# Patient Record
Sex: Female | Born: 2000 | Race: White | Hispanic: No | Marital: Single | Smoking: Never smoker
Health system: Southern US, Community
[De-identification: ages and names within clinical notes are randomized; demographics above are authoritative.]

## PROBLEM LIST (undated history)

## (undated) DIAGNOSIS — M419 Scoliosis, unspecified: Secondary | ICD-10-CM

## (undated) DIAGNOSIS — F329 Major depressive disorder, single episode, unspecified: Secondary | ICD-10-CM

## (undated) DIAGNOSIS — F32A Depression, unspecified: Secondary | ICD-10-CM

---
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
## 2020-02-19 ENCOUNTER — Other Ambulatory Visit: Payer: Self-pay

## 2020-02-19 ENCOUNTER — Emergency Department: Payer: BC Managed Care – PPO

## 2020-02-19 ENCOUNTER — Emergency Department
Admission: EM | Admit: 2020-02-19 | Discharge: 2020-02-19 | Disposition: A | Discharge status: Home / Self Care | Payer: BC Managed Care – PPO | Attending: Emergency Medicine | Admitting: Emergency Medicine

## 2020-02-19 DIAGNOSIS — Y998 Other external cause status: Secondary | ICD-10-CM | POA: Insufficient documentation

## 2020-02-19 DIAGNOSIS — Y9389 Activity, other specified: Secondary | ICD-10-CM | POA: Insufficient documentation

## 2020-02-19 DIAGNOSIS — S299XXA Unspecified injury of thorax, initial encounter: Secondary | ICD-10-CM | POA: Diagnosis present

## 2020-02-19 DIAGNOSIS — Y92411 Interstate highway as the place of occurrence of the external cause: Secondary | ICD-10-CM | POA: Diagnosis not present

## 2020-02-19 DIAGNOSIS — S9031XA Contusion of right foot, initial encounter: Secondary | ICD-10-CM | POA: Insufficient documentation

## 2020-02-19 DIAGNOSIS — S2220XA Unspecified fracture of sternum, initial encounter for closed fracture: Secondary | ICD-10-CM | POA: Insufficient documentation

## 2020-02-19 DIAGNOSIS — S3991XA Unspecified injury of abdomen, initial encounter: Secondary | ICD-10-CM | POA: Diagnosis not present

## 2020-02-19 LAB — URINALYSIS, COMPLETE (UACMP) WITH MICROSCOPIC
Bacteria, UA: NONE SEEN
Bilirubin Urine: NEGATIVE
Glucose, UA: NEGATIVE mg/dL
Hgb urine dipstick: NEGATIVE
Ketones, ur: 20 mg/dL — AB
Leukocytes,Ua: NEGATIVE
Nitrite: NEGATIVE
Protein, ur: NEGATIVE mg/dL
Specific Gravity, Urine: 1.019 (ref 1.005–1.030)
WBC, UA: NONE SEEN WBC/hpf (ref 0–5)
pH: 8 (ref 5.0–8.0)

## 2020-02-19 LAB — POCT PREGNANCY, URINE: Preg Test, Ur: NEGATIVE

## 2020-02-19 LAB — CBC WITH DIFFERENTIAL/PLATELET
Abs Immature Granulocytes: 0.05 10*3/uL (ref 0.00–0.07)
Basophils Absolute: 0 10*3/uL (ref 0.0–0.1)
Basophils Relative: 0 %
Eosinophils Absolute: 0.1 10*3/uL (ref 0.0–0.5)
Eosinophils Relative: 1 %
HCT: 38.3 % (ref 36.0–46.0)
Hemoglobin: 12.9 g/dL (ref 12.0–15.0)
Immature Granulocytes: 1 %
Lymphocytes Relative: 11 %
Lymphs Abs: 1.2 10*3/uL (ref 0.7–4.0)
MCH: 30.1 pg (ref 26.0–34.0)
MCHC: 33.7 g/dL (ref 30.0–36.0)
MCV: 89.3 fL (ref 80.0–100.0)
Monocytes Absolute: 0.6 10*3/uL (ref 0.1–1.0)
Monocytes Relative: 6 %
Neutro Abs: 8.9 10*3/uL — ABNORMAL HIGH (ref 1.7–7.7)
Neutrophils Relative %: 81 %
Platelets: 318 10*3/uL (ref 150–400)
RBC: 4.29 MIL/uL (ref 3.87–5.11)
RDW: 13.5 % (ref 11.5–15.5)
WBC: 10.9 10*3/uL — ABNORMAL HIGH (ref 4.0–10.5)
nRBC: 0 % (ref 0.0–0.2)

## 2020-02-19 LAB — COMPREHENSIVE METABOLIC PANEL
ALT: 20 U/L (ref 0–44)
AST: 28 U/L (ref 15–41)
Albumin: 4.2 g/dL (ref 3.5–5.0)
Alkaline Phosphatase: 84 U/L (ref 38–126)
Anion gap: 12 (ref 5–15)
BUN: 8 mg/dL (ref 6–20)
CO2: 20 mmol/L — ABNORMAL LOW (ref 22–32)
Calcium: 9.3 mg/dL (ref 8.9–10.3)
Chloride: 106 mmol/L (ref 98–111)
Creatinine, Ser: 0.68 mg/dL (ref 0.44–1.00)
GFR calc Af Amer: >60 mL/min (ref >60)
GFR calc non Af Amer: >60 mL/min (ref >60)
Glucose, Bld: 72 mg/dL (ref 70–99)
Potassium: 3.2 mmol/L — ABNORMAL LOW (ref 3.5–5.1)
Sodium: 138 mmol/L (ref 135–145)
Total Bilirubin: 1 mg/dL (ref 0.3–1.2)
Total Protein: 7.7 g/dL (ref 6.5–8.1)

## 2020-02-19 LAB — TROPONIN I (HIGH SENSITIVITY): Troponin I (High Sensitivity): <2 ng/L (ref <18)

## 2020-02-19 MED ORDER — MORPHINE SULFATE (PF) 4 MG/ML IV SOLN
4.0000 mg | Freq: Once | INTRAVENOUS | Status: AC
  Administered 2020-02-19: 18:00:00 4 mg via INTRAVENOUS
  Filled 2020-02-19: qty 1

## 2020-02-19 MED ORDER — ONDANSETRON HCL 4 MG/2ML IJ SOLN
4.0000 mg | Freq: Once | INTRAMUSCULAR | Status: AC
  Administered 2020-02-19: 4 mg via INTRAVENOUS
  Filled 2020-02-19: qty 2

## 2020-02-19 MED ORDER — OXYCODONE-ACETAMINOPHEN 5-325 MG PO TABS: 1.0000 | ORAL_TABLET | ORAL | 0 refills | Status: AC | PRN

## 2020-02-19 MED ORDER — OXYCODONE-ACETAMINOPHEN 5-325 MG PO TABS
1.0000 | ORAL_TABLET | Freq: Once | ORAL | Status: AC
  Administered 2020-02-19: 23:00:00 1 via ORAL
  Filled 2020-02-19: qty 1

## 2020-02-19 MED ORDER — IOHEXOL 300 MG/ML  SOLN
100.0000 mL | Freq: Once | INTRAMUSCULAR | Status: AC | PRN
  Administered 2020-02-19: 22:00:00 100 mL via INTRAVENOUS
  Filled 2020-02-19: qty 100
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
## 2020-02-19 NOTE — ED Notes (Signed)
Attempted for 2nd IV without success. PT hard stick d/t extreme eczema. Will place IV team consult and have 2nd RN try.

## 2020-02-19 NOTE — ED Notes (Addendum)
Pt attempting to provide urine sample. Pt's boyfriend at bedside.

## 2020-02-19 NOTE — ED Notes (Signed)
Called lab inquiring about missing Trop results. Amber in lab states trop tube Amy RN sent hemolyzed as well so she is sending phlebotomy to collect a new sample.

## 2020-02-19 NOTE — Discharge Instructions (Addendum)
Follow-up with your regular doctor if not improving in 3 to 5 days.  Return the emergency department if chest pain, difficulty breathing, or any abdominal pain.  Apply ice to the sternum.  Take ibuprofen for pain.  Percocet for pain not relieved by ibuprofen.  Wear the Ace wrap on your foot for comfort.  Return as needed.

## 2020-02-19 NOTE — ED Notes (Signed)
poct pregnancy Negative 

## 2020-02-19 NOTE — ED Notes (Addendum)
Will collect 2nd grn tube for trop as lab states it hemolyzed. Unable to pull from 20g IV as it infiltrated.
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
